# Patient Record
Sex: Male | Born: 1990 | Race: White | Hispanic: No | Marital: Single | Smoking: Former smoker
Health system: Southern US, Community
[De-identification: ages and names within clinical notes are randomized; demographics above are authoritative.]

## PROBLEM LIST (undated history)

## (undated) DIAGNOSIS — K5190 Ulcerative colitis, unspecified, without complications: Secondary | ICD-10-CM

---
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
## 2014-01-26 ENCOUNTER — Other Ambulatory Visit: Payer: Self-pay

## 2014-01-31 LAB — CULTURE, BLOOD (SINGLE)

## 2017-06-18 ENCOUNTER — Encounter: Payer: Self-pay | Admitting: Emergency Medicine

## 2017-06-18 ENCOUNTER — Emergency Department
Admission: EM | Admit: 2017-06-18 | Discharge: 2017-06-19 | Disposition: A | Discharge status: Home / Self Care | Payer: BLUE CROSS/BLUE SHIELD | Attending: Emergency Medicine | Admitting: Emergency Medicine

## 2017-06-18 ENCOUNTER — Emergency Department: Payer: BLUE CROSS/BLUE SHIELD

## 2017-06-18 DIAGNOSIS — Z87891 Personal history of nicotine dependence: Secondary | ICD-10-CM | POA: Insufficient documentation

## 2017-06-18 DIAGNOSIS — T401X1A Poisoning by heroin, accidental (unintentional), initial encounter: Secondary | ICD-10-CM

## 2017-06-18 HISTORY — DX: Ulcerative colitis, unspecified, without complications: K51.90

## 2017-06-18 MED ORDER — ONDANSETRON HCL 4 MG/2ML IJ SOLN
4.0000 mg | Freq: Once | INTRAMUSCULAR | Status: AC
  Administered 2017-06-18: 4 mg via INTRAVENOUS

## 2017-06-18 MED ORDER — ONDANSETRON HCL 4 MG/2ML IJ SOLN
INTRAMUSCULAR | Status: AC
  Administered 2017-06-18: 4 mg via INTRAVENOUS
  Filled 2017-06-18: qty 2

## 2017-06-18 MED ORDER — NALOXONE HCL 4 MG/0.1ML NA LIQD
1.0000 | Freq: Once | NASAL | Status: AC
  Administered 2017-06-18: 1 via NASAL
  Filled 2017-06-18: qty 4

## 2017-06-18 MED ORDER — SODIUM CHLORIDE 0.9 % IV SOLN
8.0000 mg | Freq: Once | INTRAVENOUS | Status: DC
  Filled 2017-06-18: qty 4

## 2017-06-18 MED ORDER — SODIUM CHLORIDE 0.9 % IV BOLUS (SEPSIS)
1000.0000 mL | Freq: Once | INTRAVENOUS | Status: AC
  Administered 2017-06-18: 1000 mL via INTRAVENOUS

## 2017-06-18 MED ORDER — SODIUM CHLORIDE 0.9 % IV SOLN: 8.0000 mg | Freq: Once | INTRAVENOUS | Status: DC

## 2017-06-18 MED ORDER — ONDANSETRON HCL 4 MG/2ML IJ SOLN
4.0000 mg | Freq: Once | INTRAMUSCULAR | Status: AC
  Administered 2017-06-19: 4 mg via INTRAVENOUS
  Filled 2017-06-18: qty 2

## 2017-06-18 NOTE — ED Provider Notes (Addendum)
[facility] Emergency Department Provider Note  ____________________________________________   First MD Initiated Contact with Patient 06/18/17 2257     (approximate)  I have reviewed the triage vital signs and the nursing notes.   HISTORY  Chief Complaint Drug Overdose   HPI Jordan Shaw is a 26 y.o. male who comes to the emergency department via EMS after an accidental heroin overdose. 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 powder. When EMS arrived they found the patient unconscious and bradypnic with a pulse ox of 80% and a capnography of 99he was given 0.5 mg of naloxone intravenously and he was bagged for roughly 5-6 minutes. He never had chest compressions and never lost pulses. After about 3 minutes he regained consciousness and vomited several times. She was subsequently given 4 mg of Zofran orally. The patient is contrary saying this was an accidental overdose and he does not want to continue using heroin. He currently has no symptoms aside from nausea.  He denies alcohol use and he denies other ingestion.  he reports a family history of hypertension.   Past Medical History:  Diagnosis Date  . Ulcerative colitis (HCC)     There are no active problems to display for this patient.   History reviewed. No pertinent surgical history.  Prior to Admission medications   Not on File    Allergies Patient has no allergy information on record.  No family history on file.  Social History Social History  Substance Use Topics  . Smoking status: Former Games developer  . Smokeless tobacco: Former Neurosurgeon  . Alcohol use Yes    Review of Systems Constitutional: No fever/chills Eyes: No visual changes. ENT: No sore throat. Cardiovascular: Denies chest pain. Respiratory: Denies shortness of breath. Gastrointestinal: No abdominal pain.  Positive nausea, no vomiting.  No  diarrhea.  No constipation. Genitourinary: Negative for dysuria. Musculoskeletal: Negative for back pain. Skin: Negative for rash. Neurological: Negative for headaches, focal weakness or numbness.   ____________________________________________   PHYSICAL EXAM:  VITAL SIGNS: ED Triage Vitals  Enc Vitals Group     BP      Pulse      Resp      Temp      Temp src      SpO2      Weight      Height      Head Circumference      Peak Flow      Pain Score      Pain Loc      Pain Edu?      Excl. in GC?     Constitutional: alert and oriented 4 holding a vomit bag appears uncomfortable but no respiratory distress Eyes: PERRL EOMI.pupils midrange and brisk Head: Atraumatic. Nose: No congestion/rhinnorhea. Mouth/Throat: No trismus Neck: No stridor.   Cardiovascular: Normal rate, regular rhythm. Grossly normal heart sounds.  Good peripheral circulation. Respiratory: Normal respiratory effort.  No retractions. Lungs CTAB and moving good air Gastrointestinal: soft nondistended nontender no rebound or guarding no peritonitis Musculoskeletal: No lower extremity edema   Neurologic:  Normal speech and language. No gross focal neurologic deficits are appreciated. Skin:  Skin is warm, dry and intact. No rash noted. Psychiatric: Mood and affect are normal. Speech and behavior are normal.    ____________________________________________   DIFFERENTIAL includes but not limited to  heroin overdose, suicide attempt, accidental overdose, metabolic derangement ____________________________________________  LABS (all labs ordered are listed, but only abnormal results are displayed)  Labs Reviewed - No data to display  _____________________________________  EKG  ED ECG REPORT I, Merrily BrittleNeil Valentino Saavedra, the attending physician, personally viewed and interpreted this ECG. Date: 06/18/2017 EKG Time:  Rate: 77 Rhythm: normal sinus rhythm QRS Axis: normal Intervals: normal ST/T Wave  abnormalities: normal Narrative Interpretation: no evidence of acute ischemia  ____________________________________________  RADIOLOGY  chest x-ray with no acute disease ____________________________________________   PROCEDURES  Procedure(s) performed: no  Procedures  Critical Care performed: no  Observation: yes  ----------------------------------------- 11:07 PM on 06/18/2017 -----------------------------------------   OBSERVATION CARE: This patient is being placed under observation care for the following reasons: heroin overdose requiring naloxone. He requires 4 hours of observation to ensure that the heroin does not stop his breathing once the naloxone is gone from the system.     ____________________________________________   INITIAL IMPRESSION / ASSESSMENT AND PLAN / ED COURSE  Pertinent labs & imaging results that were available during my care of the patient were reviewed by me and considered in my medical decision making (see chart for details).  the patient arrives hemodynamically stable and well appearing. His lungs are clear. He is saturating well on room air. The concern after an accidental heroin overdose is having associated pulmonary edema so he will requirea chest x-ray and observation with continuous pulse ox.    ----------------------------------------- 11:59 PM on 06/18/2017 -----------------------------------------  I ordered intranasal naloxone according to our policy so the patient can go home with that, however I inadvertently did not mark it obviously for home use and the patient received an additional dose of naloxone. ____________________________________________  ----------------------------------------- 1:39 AM on 06/19/2017 ----------------------------------------- The patient is calm and cooperative with no signs of recurrent overdose. We'll continue to monitor.   ----------------------------------------- 3:07 AM on  06/19/2017 -----------------------------------------   END OF OBSERVATION STATUS: After an appropriate period of observation, this patient is being discharged due to the following reason(s):  he is awake, appropriate, saturating well. He is discharged home with his family.   FINAL CLINICAL IMPRESSION(S) / ED DIAGNOSES  Final diagnoses:  Accidental overdose of heroin, initial encounter      NEW MEDICATIONS STARTED DURING THIS VISIT:  New Prescriptions   No medications on file     Note:  This document was prepared using Dragon voice recognition software and may include unintentional dictation errors.     Merrily Brittleifenbark, Kamarius Buckbee, MD 06/19/17 [id number]    Merrily Brittleifenbark, Keon Pender, MD 06/19/17 [phone]

## 2017-06-18 NOTE — ED Triage Notes (Signed)
Pt arrived via ems. Pt unconscious upon ems arrival and was bagged by ems for roughly 5 minutes. Pt was then given 0.5 of narcan which elicited alertness and caused pt to begin vomiting. EMS then gave pt  po zofran. Pt does reports using heroin today. Upon arrival to ED Pt alert and oriente, denies any pain. Pt remains nauseous

## 2017-06-19 MED ORDER — CLONIDINE HCL 0.1 MG PO TABS
0.1000 mg | ORAL_TABLET | Freq: Once | ORAL | Status: AC
  Administered 2017-06-19: 0.1 mg via ORAL
  Filled 2017-06-19: qty 1

## 2017-06-19 NOTE — Discharge Instructions (Addendum)
Please follow up with your PMD as needed and never use heroin again.  Please check out [facility] for help with your recovery process [email or web address]  It was a pleasure to take care of you today, and thank you for coming to our emergency department.  If you have any questions or concerns before leaving please ask the nurse to grab me and I'm more than happy to go through your aftercare instructions again.  If you were prescribed any opioid pain medication today such as Norco, Vicodin, Percocet, morphine, hydrocodone, or oxycodone please make sure you do not drive when you are taking this medication as it can alter your ability to drive safely.  If you have any concerns once you are home that you are not improving or are in fact getting worse before you can make it to your follow-up appointment, please do not hesitate to call 911 and come back for further evaluation.  Merrily BrittleNeil Kahliyah Dick, MD  Results for orders placed or performed in visit on 01/26/14  Culture, blood (single)  Result Value Ref Range   Micro Text Report         COMMENT                   NO GROWTH AEROBICALLY/ANAEROBICALLY IN 5 DAYS   ANTIBIOTIC                                                      Culture, blood (single)  Result Value Ref Range   Micro Text Report         COMMENT                   NO GROWTH AEROBICALLY/ANAEROBICALLY IN 5 DAYS   ANTIBIOTIC                                                       Dg Chest Port 1 View  Result Date: 06/18/2017 CLINICAL DATA:  Heroin overdose status post Narcan. EXAM: PORTABLE CHEST 1 VIEW COMPARISON:  None. FINDINGS: The heart size and mediastinal contours are within normal limits. Both lungs are clear. The visualized skeletal structures are unremarkable. IMPRESSION: No active disease. Electronically Signed   By: Tollie Ethavid  Kwon M.D.   On: 06/18/2017 23:28

## 2018-02-02 IMAGING — DX DG CHEST 1V PORT
1 series · 1 of 1 positions shown · non-contrast
Comparison: None.

CLINICAL DATA: Heroin overdose status post Narcan.

EXAM:
PORTABLE CHEST 1 VIEW

[chest ap]
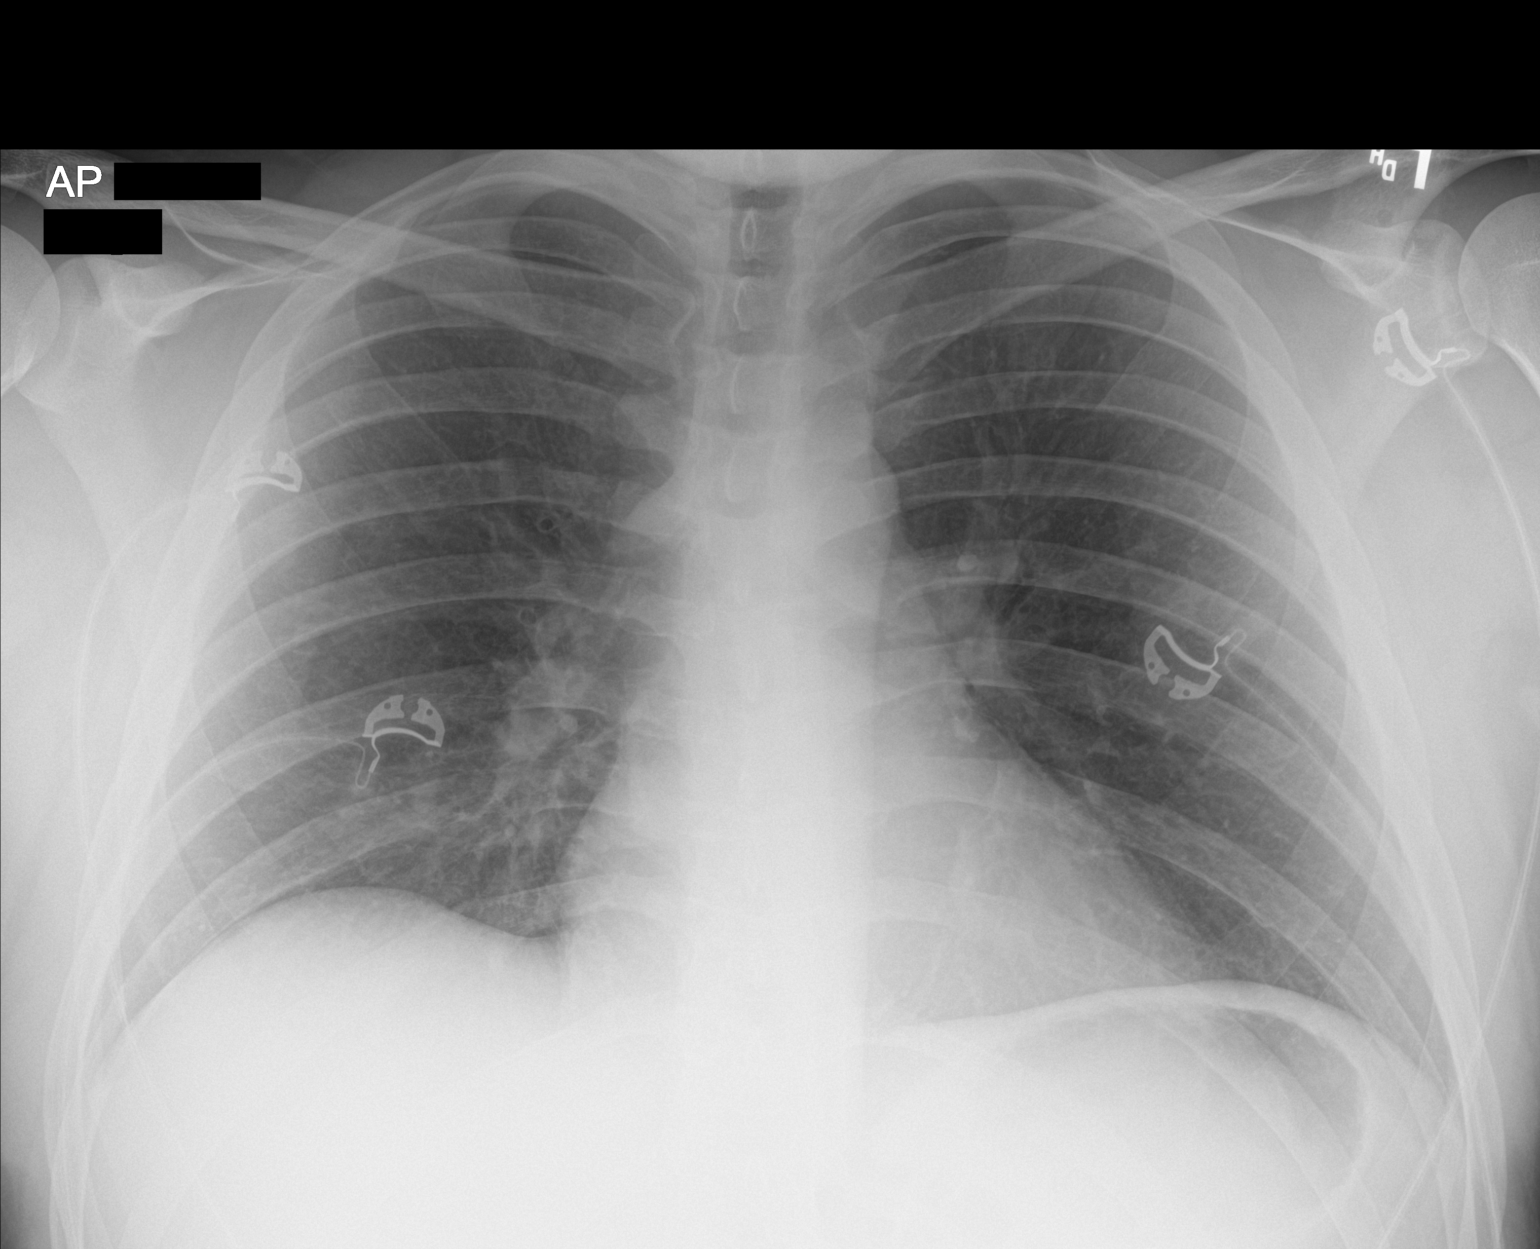

[1 of 1 positions shown; findings below may reference images not displayed]

FINDINGS: The heart size and mediastinal contours are within normal limits.
Both lungs are clear. The visualized skeletal structures are
unremarkable.
IMPRESSION: No active disease.

## 2019-11-03 ENCOUNTER — Ambulatory Visit: Payer: 59 | Attending: Internal Medicine

## 2019-11-03 DIAGNOSIS — Z20822 Contact with and (suspected) exposure to covid-19: Secondary | ICD-10-CM

## 2019-11-04 LAB — NOVEL CORONAVIRUS, NAA: SARS-CoV-2, NAA: NOT DETECTED

## 2023-03-25 ENCOUNTER — Other Ambulatory Visit: Payer: Self-pay | Admitting: Gastroenterology

## 2023-03-25 DIAGNOSIS — R7989 Other specified abnormal findings of blood chemistry: Secondary | ICD-10-CM

## 2023-03-29 ENCOUNTER — Ambulatory Visit
Admission: RE | Admit: 2023-03-29 | Discharge: 2023-03-29 | Disposition: A | Discharge status: Home / Self Care | Payer: 59 | Source: Ambulatory Visit | Attending: Gastroenterology | Admitting: Gastroenterology

## 2023-03-29 DIAGNOSIS — R7989 Other specified abnormal findings of blood chemistry: Secondary | ICD-10-CM
